# Patient Record
Sex: Female | Born: 1980 | Race: Black or African American | Hispanic: No | Marital: Single | Smoking: Current every day smoker
Health system: Southern US, Community
[De-identification: ages and names within clinical notes are randomized; demographics above are authoritative.]

## PROBLEM LIST (undated history)

## (undated) DIAGNOSIS — I10 Essential (primary) hypertension: Secondary | ICD-10-CM

---

## 2007-12-01 ENCOUNTER — Emergency Department (HOSPITAL_COMMUNITY): Admission: EM | Admit: 2007-12-01 | Discharge: 2007-12-01 | Payer: Self-pay | Admitting: Emergency Medicine

## 2008-03-06 ENCOUNTER — Emergency Department (HOSPITAL_COMMUNITY): Admission: EM | Admit: 2008-03-06 | Discharge: 2008-03-06 | Payer: Self-pay | Admitting: Emergency Medicine

## 2011-04-24 LAB — BASIC METABOLIC PANEL
CO2: 29
Chloride: 102
Creatinine, Ser: 0.72
GFR calc Af Amer: >60
Potassium: 3.7
Sodium: 137

## 2011-04-24 LAB — URINALYSIS, ROUTINE W REFLEX MICROSCOPIC
Bilirubin Urine: NEGATIVE
Glucose, UA: NEGATIVE
Ketones, ur: NEGATIVE
Protein, ur: NEGATIVE
pH: 6

## 2011-04-24 LAB — CBC
HCT: 37.7
Hemoglobin: 12.6
MCHC: 33.3
MCV: 84.2
RBC: 4.48
WBC: 5.8

## 2011-04-24 LAB — DIFFERENTIAL
Basophils Relative: 0
Eosinophils Absolute: 0.1
Eosinophils Relative: 2
Lymphs Abs: 1.7
Monocytes Absolute: 0.5
Monocytes Relative: 9
Neutrophils Relative %: 59

## 2011-04-24 LAB — URINE MICROSCOPIC-ADD ON

## 2012-01-25 ENCOUNTER — Encounter (HOSPITAL_COMMUNITY): Payer: Self-pay | Admitting: *Deleted

## 2012-01-25 ENCOUNTER — Emergency Department (HOSPITAL_COMMUNITY): Payer: Self-pay

## 2012-01-25 ENCOUNTER — Emergency Department (HOSPITAL_COMMUNITY)
Admission: EM | Admit: 2012-01-25 | Discharge: 2012-01-25 | Disposition: A | Discharge status: Home / Self Care | Payer: Self-pay | Attending: Emergency Medicine | Admitting: Emergency Medicine

## 2012-01-25 DIAGNOSIS — I10 Essential (primary) hypertension: Secondary | ICD-10-CM | POA: Insufficient documentation

## 2012-01-25 DIAGNOSIS — S63619A Unspecified sprain of unspecified finger, initial encounter: Secondary | ICD-10-CM

## 2012-01-25 DIAGNOSIS — S6390XA Sprain of unspecified part of unspecified wrist and hand, initial encounter: Secondary | ICD-10-CM | POA: Insufficient documentation

## 2012-01-25 DIAGNOSIS — X58XXXA Exposure to other specified factors, initial encounter: Secondary | ICD-10-CM | POA: Insufficient documentation

## 2012-01-25 HISTORY — DX: Essential (primary) hypertension: I10

## 2012-01-25 MED ORDER — MELOXICAM 7.5 MG PO TABS: 7.5000 mg | ORAL_TABLET | Freq: Every day | ORAL | Status: AC

## 2012-01-25 MED ORDER — KETOROLAC TROMETHAMINE 10 MG PO TABS
10.0000 mg | ORAL_TABLET | Freq: Once | ORAL | Status: AC
  Administered 2012-01-25: 10 mg via ORAL
  Filled 2012-01-25: qty 1

## 2012-01-25 NOTE — ED Notes (Signed)
Patient with no complaints at this time. Respirations even and unlabored. Skin warm/dry. Discharge instructions reviewed with patient at this time. Patient given opportunity to voice concerns/ask questions. Patient discharged at this time and left Emergency Department with steady gait.   

## 2012-01-25 NOTE — ED Notes (Signed)
Pt says she has not had a period in 3 years, she says she is "post menopausal".

## 2012-01-25 NOTE — ED Notes (Signed)
Pt. Relates that 3wks ago at work, R middle finger began to swell at knuckle joint.  Does not recall any injury that caused this and she has not hx of arthritis or any inflammatory illnesses.

## 2012-01-25 NOTE — ED Provider Notes (Signed)
History     CSN: [id number]  Arrival date & time 01/25/12  1059   First MD Initiated Contact with Patient 01/25/12 1209      Chief Complaint  Patient presents with  . Finger Injury    (Consider location/radiation/quality/duration/timing/severity/associated sxs/prior treatment) HPI Comments: Patient reports problems with pain in the left middle finger for approximately 3 weeks. She is unsure of the injury. She does not recall a bite of any kind. She's not had any fever. There's been no drainage from the middle finger. She presents at this time because she says she is concerned that it has been swollen and painful for so long. There's been no previous operations or procedures involving the left third finger.  The history is provided by the patient.    Past Medical History  Diagnosis Date  . Hypertension     History reviewed. No pertinent past surgical history.  History reviewed. No pertinent family history.  History  Substance Use Topics  . Smoking status: Never Smoker   . Smokeless tobacco: Not on file  . Alcohol Use: No    OB History    Grav Para Term Preterm Abortions TAB SAB Ect Mult Living                  Review of Systems  Constitutional: Negative for activity change.       All ROS Neg except as noted in HPI  HENT: Negative for nosebleeds and neck pain.   Eyes: Negative for photophobia and discharge.  Respiratory: Negative for cough, shortness of breath and wheezing.   Cardiovascular: Negative for chest pain and palpitations.  Gastrointestinal: Negative for abdominal pain and blood in stool.  Genitourinary: Negative for dysuria, frequency and hematuria.  Musculoskeletal: Negative for back pain and arthralgias.  Skin: Negative.   Neurological: Negative for dizziness, seizures and speech difficulty.  Psychiatric/Behavioral: Negative for hallucinations and confusion.    Allergies  Vicodin  Home Medications  No current outpatient prescriptions on  file.  BP 138/82  Pulse 78  Temp 97.9 F (36.6 C) (Oral)  Resp 20  Ht 5\' 6"  (1.676 m)  Wt 220 lb (99.791 kg)  BMI 35.51 kg/m2  SpO2 100%  Physical Exam  Nursing note and vitals reviewed. Constitutional: She is oriented to person, place, and time. She appears well-developed and well-nourished.  Non-toxic appearance.  HENT:  Head: Normocephalic.  Right Ear: Tympanic membrane and external ear normal.  Left Ear: Tympanic membrane and external ear normal.  Eyes: EOM and lids are normal. Pupils are equal, round, and reactive to light.  Neck: Normal range of motion. Neck supple. Carotid bruit is not present.  Cardiovascular: Normal rate, regular rhythm, normal heart sounds, intact distal pulses and normal pulses.   Pulmonary/Chest: Breath sounds normal. No respiratory distress.  Abdominal: Soft. Bowel sounds are normal. There is no tenderness. There is no guarding.  Musculoskeletal: Normal range of motion.       There is pain to palpation and flexion and extension of the PIP joint of the left third finger. There is mild swelling just above and just below the PIP joint. The area is not hot. There no bite marks appreciated. There's no deformity of the finger or the hand. There is good capillary refill and sensory.`  Lymphadenopathy:       Head (right side): No submandibular adenopathy present.       Head (left side): No submandibular adenopathy present.    She has no cervical adenopathy.  Neurological:  She is alert and oriented to person, place, and time. She has normal strength. No cranial nerve deficit or sensory deficit.  Skin: Skin is warm and dry.  Psychiatric: She has a normal mood and affect. Her speech is normal.    ED Course  Procedures (including critical care time)  Labs Reviewed - No data to display Dg Finger Middle Left  01/25/2012  *RADIOLOGY REPORT*  Clinical Data: Left middle finger pain and swelling at the PIP joint.  No known injury.  LEFT MIDDLE FINGER 2+V   Comparison: None.  Findings: Soft tissue swelling at the level of the third PIP joint. The underlying bones have normal appearances.  No soft tissue gas or radiopaque foreign body.  IMPRESSION: Soft tissue swelling at the level of the third PIP joint without underlying bony abnormality.  Original Report Authenticated By: Darrol Angel, M.D.     No diagnosis found.    MDM  I have reviewed nursing notes, vital signs, and all appropriate lab and imaging results for this patient.  Patient has a swollen left third finger. It is believed at this time that this is probably related to a sprain, As this has been going on for 3 weeks. The patient is fitted with a finger splint. She is treated with Mobic 7.5 mg twice a day. She will be allowed to use Tylenol in between the doses of Mobic if needed. She is given the name of the orthopedist if she should have any additional problems with this finger.     Kathie Dike, [place] 01/25/12 1353

## 2012-01-25 NOTE — Discharge Instructions (Signed)
Please use the finger splint for the next 7-10 days. Mobic 2 times daily with food. May use Tylenol Extra Strength in between the Mobic if needed for soreness. Please see Dr. Hilda Lias for evaluation if this is not improved in the next 3-4 days..Finger Sprain A finger sprain is a tear in one of the strong, fibrous tissues that connect the bones (ligaments) in your finger. The severity of the sprain depends on how much of the ligament is torn. The tear can be either partial or complete. CAUSES  Often, sprains are a result of a fall or accident. If you extend your hands to catch an object or to protect yourself, the force of the impact causes the fibers of your ligament to stretch too much. This excess tension causes the fibers of your ligament to tear. SYMPTOMS  You may have some loss of motion in your finger. Other symptoms include:  Bruising.   Tenderness.   Swelling.  DIAGNOSIS  In order to diagnose finger sprain, your caregiver will physically examine your finger or thumb to determine how torn the ligament is. Your caregiver may also suggest an X-ray exam of your finger to make sure no bones are broken. TREATMENT  If your ligament is only partially torn, treatment usually involves keeping the finger in a fixed position (immobilization) for a short period. To do this, your caregiver will apply a bandage, cast, or splint to keep your finger from moving until it heals. For a partially torn ligament, the healing process usually takes 2 to 3 weeks. If your ligament is completely torn, you may need surgery to reconnect the ligament to the bone. After surgery a cast or splint will be applied and will need to stay on your finger or thumb for 4 to 6 weeks while your ligament heals. HOME CARE INSTRUCTIONS  Keep your injured finger elevated, when possible, to decrease swelling.   To ease pain and swelling, apply ice to your joint twice a day, for 2 to 3 days:   Put ice in a plastic bag.   Place a  towel between your skin and the bag.   Leave the ice on for 15 minutes.   Only take over-the-counter or prescription medicine for pain as directed by your caregiver.   Do not wear rings on your injured finger.   Do not leave your finger unprotected until pain and stiffness go away (usually 3 to 4 weeks).   Do not allow your cast or splint to get wet. Cover your cast or splint with a plastic bag when you shower or bathe. Do not swim.   Your caregiver may suggest special exercises for you to do during your recovery to prevent or limit permanent stiffness.  SEEK IMMEDIATE MEDICAL CARE IF:  Your cast or splint becomes damaged.   Your pain becomes worse rather than better.  MAKE SURE YOU:  Understand these instructions.   Will watch your condition.   Will get help right away if you are not doing well or get worse.  Document Released: 08/20/2004 Document Revised: 07/02/2011 Document Reviewed: 03/16/2011 [facility] Patient Information 2012 [place], [place].

## 2012-01-25 NOTE — ED Notes (Signed)
Pain LMF x 3 weeks, painful , swollen , No known injury

## 2012-01-26 NOTE — ED Provider Notes (Signed)
Medical screening examination/treatment/procedure(s) were performed by non-physician practitioner and as supervising physician I was immediately available for consultation/collaboration.  Geoffery Lyons, MD 01/26/12 [phone]

## 2012-03-07 ENCOUNTER — Emergency Department (HOSPITAL_COMMUNITY): Payer: Self-pay

## 2012-03-07 ENCOUNTER — Emergency Department (HOSPITAL_COMMUNITY)
Admission: EM | Admit: 2012-03-07 | Discharge: 2012-03-07 | Disposition: A | Discharge status: Home / Self Care | Payer: Self-pay | Attending: Emergency Medicine | Admitting: Emergency Medicine

## 2012-03-07 ENCOUNTER — Encounter (HOSPITAL_COMMUNITY): Payer: Self-pay | Admitting: *Deleted

## 2012-03-07 DIAGNOSIS — S93505A Unspecified sprain of left lesser toe(s), initial encounter: Secondary | ICD-10-CM

## 2012-03-07 DIAGNOSIS — S93699A Other sprain of unspecified foot, initial encounter: Secondary | ICD-10-CM | POA: Insufficient documentation

## 2012-03-07 DIAGNOSIS — I10 Essential (primary) hypertension: Secondary | ICD-10-CM | POA: Insufficient documentation

## 2012-03-07 DIAGNOSIS — W2203XA Walked into furniture, initial encounter: Secondary | ICD-10-CM | POA: Insufficient documentation

## 2012-03-07 MED ORDER — IBUPROFEN 800 MG PO TABS
800.0000 mg | ORAL_TABLET | Freq: Once | ORAL | Status: AC
  Administered 2012-03-07: 800 mg via ORAL
  Filled 2012-03-07: qty 1

## 2012-03-07 MED ORDER — IBUPROFEN 800 MG PO TABS: 800.0000 mg | ORAL_TABLET | Freq: Three times a day (TID) | ORAL | Status: AC

## 2012-03-07 MED ORDER — IBUPROFEN 800 MG PO TABS: 800.0000 mg | ORAL_TABLET | Freq: Once | ORAL | Status: AC

## 2012-03-07 NOTE — ED Notes (Signed)
Lt 4th toe injury this am

## 2012-03-07 NOTE — ED Notes (Signed)
Patient with no complaints at this time. Respirations even and unlabored. Skin warm/dry. Discharge instructions reviewed with patient at this time. Patient given opportunity to voice concerns/ask questions. Patient discharged at this time and left Emergency Department with steady gait.   

## 2012-03-09 NOTE — ED Provider Notes (Signed)
History     CSN: [id number]  Arrival date & time 03/07/12  1900   First MD Initiated Contact with Patient 03/07/12 1944      Chief Complaint  Patient presents with  . Toe Injury    (Consider location/radiation/quality/duration/timing/severity/associated sxs/prior treatment) HPI Comments: Sandra Acevedo presents for evaluation of pain of her left fourth toe.  She stubbed her toe against a piece of furniture in her home this morning causing pain and swelling since that time.  Pain is throbbing, constant without radiation and worse with attempts at walking and with palpation.  She denies any numbness in the  toe.  She has used ice and elevation with minimal relief of symptoms.    The history is provided by the patient.    Past Medical History  Diagnosis Date  . Hypertension     History reviewed. No pertinent past surgical history.  History reviewed. No pertinent family history.  History  Substance Use Topics  . Smoking status: Never Smoker   . Smokeless tobacco: Not on file  . Alcohol Use: No    OB History    Grav Para Term Preterm Abortions TAB SAB Ect Mult Living                  Review of Systems  Musculoskeletal: Positive for joint swelling and arthralgias.  Skin: Negative for wound.  Neurological: Negative for weakness and numbness.    Allergies  Vicodin  Home Medications   Current Outpatient Rx  Name Route Sig Dispense Refill  . IBUPROFEN 800 MG PO TABS Oral Take 1 tablet (800 mg total) by mouth 3 (three) times daily. 21 tablet 0  . IBUPROFEN 800 MG PO TABS Oral Take 1 tablet (800 mg total) by mouth once. 21 tablet 0  . MELOXICAM 7.5 MG PO TABS Oral Take 1 tablet (7.5 mg total) by mouth daily. 12 tablet 0    BP 129/84  Pulse 72  Temp 98 F (36.7 C) (Oral)  Resp 20  Ht 5\' 7"  (1.702 m)  Wt 210 lb (95.255 kg)  BMI 32.89 kg/m2  SpO2 100%  LMP 03/07/2009  Physical Exam  Constitutional: She appears well-developed and well-nourished.  HENT:    Head: Atraumatic.  Neck: Normal range of motion.  Cardiovascular:       Pulses equal bilaterally  Musculoskeletal:       Left foot: She exhibits bony tenderness and swelling. She exhibits normal capillary refill and no deformity.       Feet:  Neurological: She is alert. She has normal strength. She displays normal reflexes. No sensory deficit.       Equal strength  Skin: Skin is warm and dry.  Psychiatric: She has a normal mood and affect.    ED Course  Procedures (including critical care time)  Labs Reviewed - No data to display Dg Toe 4th Left  03/07/2012  *RADIOLOGY REPORT*  Clinical Data: Blow to the fourth toe.  Pain.  LEFT FOURTH TOE  Comparison: None.  Findings: Imaged bones, joints and soft tissues appear normal.  IMPRESSION: Negative exam.  Original Report Authenticated By: Bernadene Bell. D'ALESSIO, M.D.     1. Sprain of fourth toe of left foot       MDM  X-rays reviewed and discussed with patient.  No fracture.  She was placed in a postop shoe for comfort.  She was encouraged to continue ice and elevation as much as possible for the next several days.  Ibuprofen 800  mg 3 times a day.  Followup with PCP if symptoms are not improving over the next week.        Burgess Amor, [place] 03/09/12 1538

## 2012-03-13 NOTE — ED Provider Notes (Signed)
Medical screening examination/treatment/procedure(s) were performed by non-physician practitioner and as supervising physician I was immediately available for consultation/collaboration.   Shelda Jakes, MD 03/13/12 [phone]

## 2013-02-01 DIAGNOSIS — R079 Chest pain, unspecified: Secondary | ICD-10-CM

## 2014-01-12 IMAGING — CR DG TOE 4TH 2+V*L*
2 series · 2 of 2 positions shown · non-contrast
Comparison: None.

CLINICAL DATA: Blow to the fourth toe.  Pain.

LEFT FOURTH TOE

[view not recorded (1 of 2)]
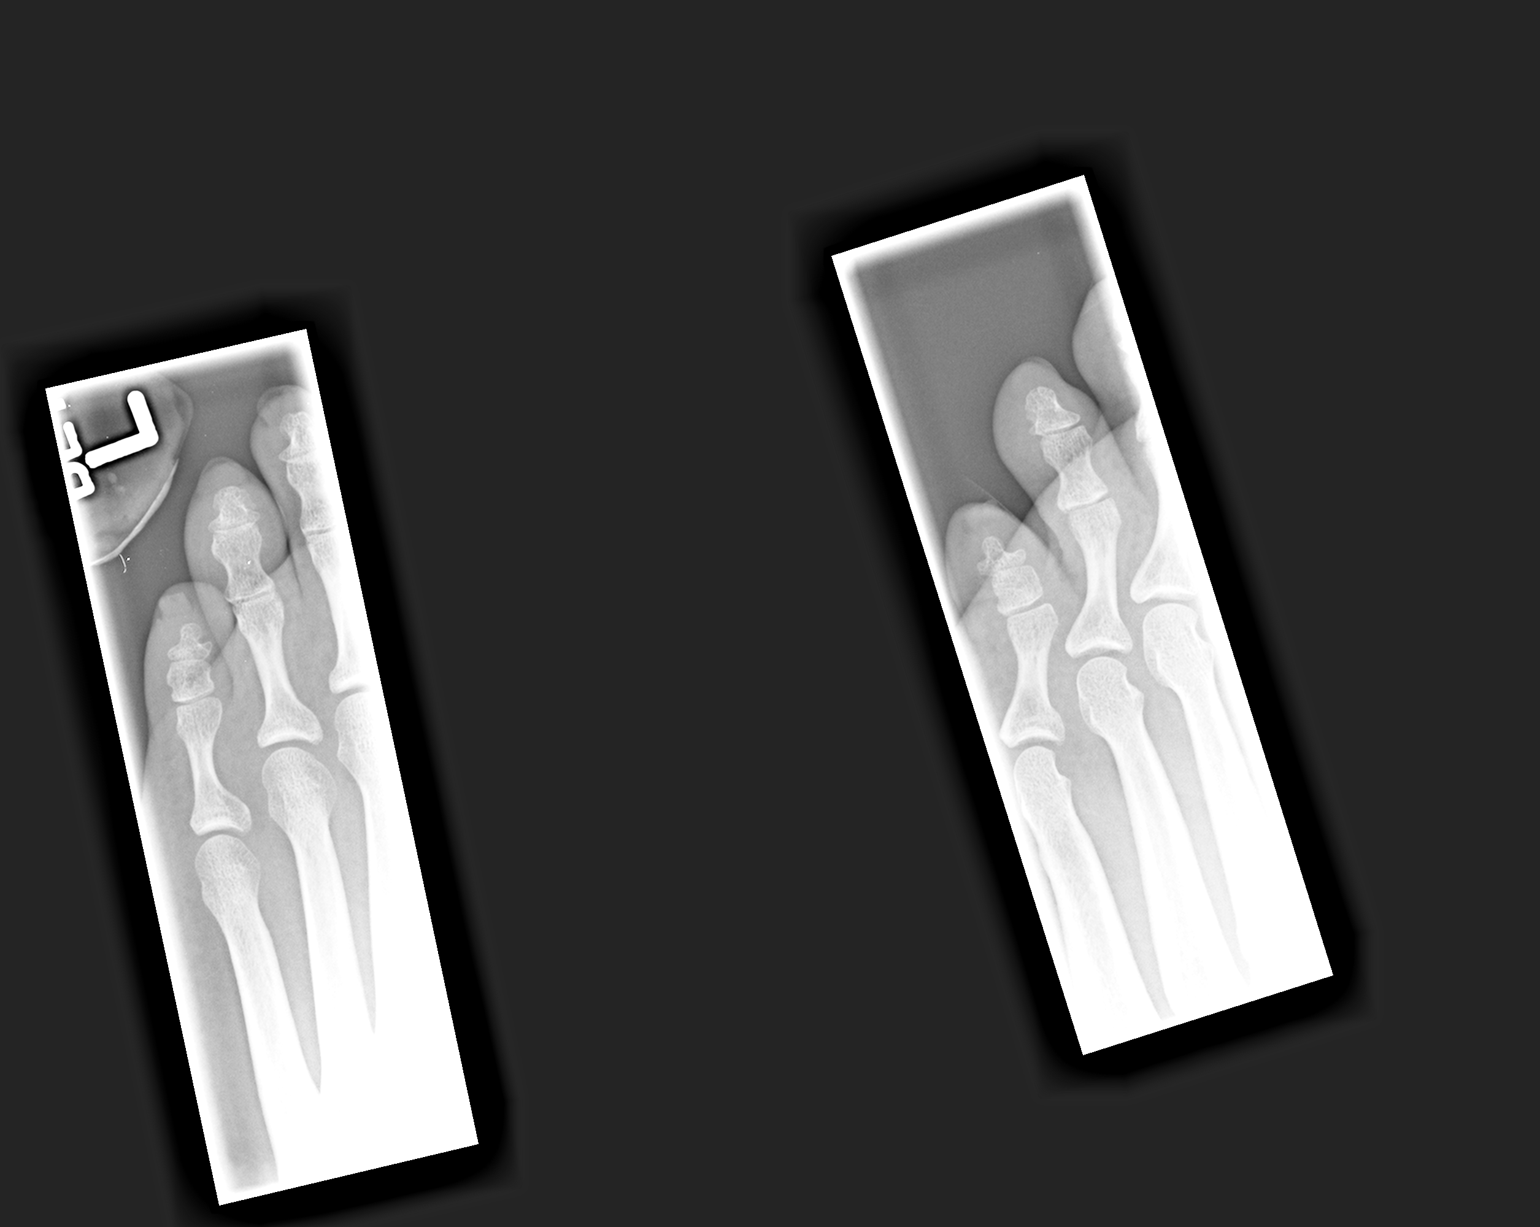

[view not recorded (2 of 2)]
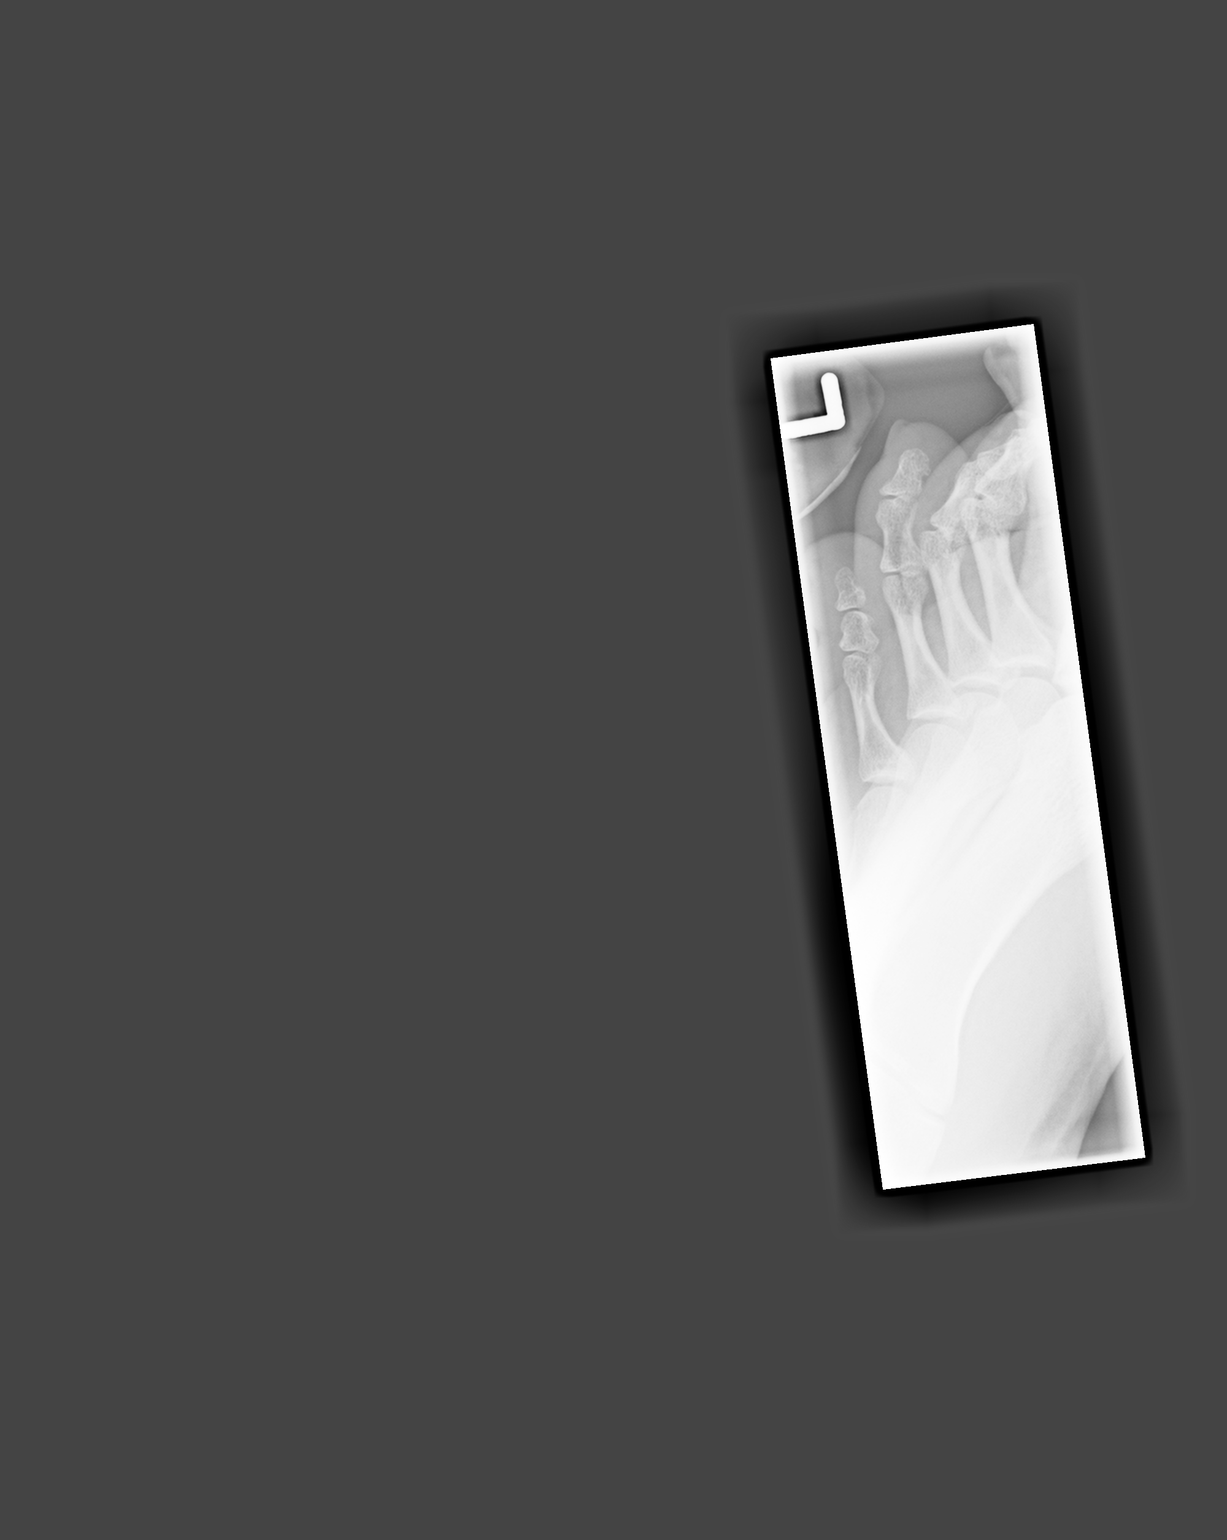

[2 of 2 positions shown; findings below may reference images not displayed]

FINDINGS: Imaged bones, joints and soft tissues appear normal.
IMPRESSION: Negative exam.

## 2017-05-15 ENCOUNTER — Encounter (HOSPITAL_COMMUNITY): Payer: Self-pay | Admitting: Emergency Medicine

## 2017-05-15 ENCOUNTER — Emergency Department (HOSPITAL_COMMUNITY)
Admission: EM | Admit: 2017-05-15 | Discharge: 2017-05-15 | Disposition: A | Discharge status: Home / Self Care | Payer: Self-pay | Attending: Emergency Medicine | Admitting: Emergency Medicine

## 2017-05-15 DIAGNOSIS — M7711 Lateral epicondylitis, right elbow: Secondary | ICD-10-CM | POA: Insufficient documentation

## 2017-05-15 DIAGNOSIS — F17210 Nicotine dependence, cigarettes, uncomplicated: Secondary | ICD-10-CM | POA: Insufficient documentation

## 2017-05-15 DIAGNOSIS — I10 Essential (primary) hypertension: Secondary | ICD-10-CM | POA: Insufficient documentation

## 2017-05-15 MED ORDER — IBUPROFEN 800 MG PO TABS
800.0000 mg | ORAL_TABLET | Freq: Once | ORAL | Status: AC
  Administered 2017-05-15: 800 mg via ORAL
  Filled 2017-05-15: qty 1

## 2017-05-15 MED ORDER — IBUPROFEN 800 MG PO TABS: 800.0000 mg | ORAL_TABLET | Freq: Three times a day (TID) | ORAL | 0 refills | Status: AC

## 2017-05-15 NOTE — ED Triage Notes (Signed)
Patient has c/o of right elbow pain approx 3 weeks ago, radiating from elbow to fingers.

## 2017-05-15 NOTE — Discharge Instructions (Signed)
Apply ice packs on/off to your elbow.  You can buy a tennis elbow strap from [facility] to wear at work.  Call Dr. Mort SawyersHarrison's office to arrange a follow-up appt. In one week if not improving

## 2017-05-15 NOTE — ED Provider Notes (Signed)
[facility] EMERGENCY DEPARTMENT Provider Note   CSN: [id number] Arrival date & time: 05/15/17  1952     History   Chief Complaint Chief Complaint  Patient presents with  . Elbow Pain    HPI Sandra Acevedo is a 36 y.o. female.  HPI   Sandra Acevedo is a 36 y.o. female who presents to the Emergency Department complaining of right elbow pain for 3 weeks.  Describes a throbbing pain to the lateral right elbow that is intermittently radiating into fingers of the right hand.  Pain worse with rotation of the elbow and full extension of the right arm.  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  She has not tried any OTC pain relievers.  She denies redness, swelling, neck or shoulder pain or numbness.    Past Medical History:  Diagnosis Date  . Hypertension     There are no active problems to display for this patient.   History reviewed. No pertinent surgical history.  OB History    No data available       Home Medications    Prior to Admission medications   Medication Sig Start Date End Date Taking? Authorizing Provider  ibuprofen (ADVIL,MOTRIN) 800 MG tablet Take 1 tablet (800 mg total) by mouth 3 (three) times daily. Take with food 05/15/17   Pauline Ausriplett, Aleece Loyd, PA-C    Family History History reviewed. No pertinent family history.  Social History Social History  Substance Use Topics  . Smoking status: Current Every Day Smoker    Packs/day: 0.50  . Smokeless tobacco: Never Used  . Alcohol use No     Allergies   Vicodin [hydrocodone-acetaminophen]   Review of Systems Review of Systems  Constitutional: Negative for chills and fever.  Cardiovascular: Negative for chest pain.  Gastrointestinal: Negative for abdominal pain, nausea and vomiting.  Genitourinary: Negative for difficulty urinating and dysuria.  Musculoskeletal: Positive for arthralgias (right elbow pain). Negative for joint swelling, neck pain and neck  stiffness.  Skin: Negative for color change and wound.  Neurological: Negative for dizziness, weakness and numbness.  All other systems reviewed and are negative.    Physical Exam Updated Vital Signs BP (!) 137/92 (BP Location: Left Arm)   Pulse 79   Temp 98.4 F (36.9 C) (Oral)   Resp 18   Ht 5\' 6"  (1.676 m)   Wt 108.9 kg (240 lb)   LMP 03/07/2009   SpO2 100%   BMI 38.74 kg/m   Physical Exam  Constitutional: She is oriented to person, place, and time. She appears well-developed and well-nourished. No distress.  HENT:  Head: Normocephalic and atraumatic.  Cardiovascular: Normal rate, regular rhythm and intact distal pulses.   Pulmonary/Chest: Effort normal and breath sounds normal.  Musculoskeletal: She exhibits tenderness. She exhibits no edema.       Right hand: She exhibits normal range of motion, normal two-point discrimination and no swelling. Normal sensation noted. Normal strength noted. She exhibits no finger abduction and no wrist extension trouble.  Focal ttp and ROM of the lateral right elbow. No edema, erythema or excessive warmth.    Neurological: She is alert and oriented to person, place, and time. No sensory deficit. She exhibits normal muscle tone. Coordination normal.  Skin: Skin is warm and dry. Capillary refill takes less than 2 seconds.  Psychiatric: She has a normal mood and affect.  Nursing note and vitals reviewed.     ED Treatments / Results  Labs (all labs ordered are listed, but only abnormal results are displayed) Labs Reviewed - No data to display  EKG  EKG Interpretation None       Radiology No results found.  Procedures Procedures (including critical care time)  Medications Ordered in ED Medications  ibuprofen (ADVIL,MOTRIN) tablet 800 mg (800 mg Oral Given 05/15/17 2021)     Initial Impression / Assessment and Plan / ED Course  I have reviewed the triage vital signs and the nursing notes.  Pertinent labs & imaging results  that were available during my care of the patient were reviewed by me and considered in my medical decision making (see chart for details).     Pt well appearing, NV intact.  Focal tenderness over the lateral epicondyle.  No motor deficits.  No concerning sx's for septic joint.  Likely inflammatory.  Pt agrees to supportive brace and NSAID.  Appears stable for d/c  Final Clinical Impressions(s) / ED Diagnoses   Final diagnoses:  Lateral epicondylitis of right elbow    New Prescriptions Discharge Medication List as of 05/15/2017  8:17 PM    START taking these medications   Details  ibuprofen (ADVIL,MOTRIN) 800 MG tablet Take 1 tablet (800 mg total) by mouth 3 (three) times daily. Take with food, Starting Sat 05/15/2017, Print         Pauline Aus, [place] 05/15/17 2300    Mesner, Barbara Cower, MD 05/17/17 1735

## 2017-08-17 ENCOUNTER — Encounter (HOSPITAL_COMMUNITY): Payer: Self-pay | Admitting: Emergency Medicine

## 2017-08-17 ENCOUNTER — Emergency Department (HOSPITAL_COMMUNITY): Payer: Self-pay

## 2017-08-17 ENCOUNTER — Emergency Department (HOSPITAL_COMMUNITY)
Admission: EM | Admit: 2017-08-17 | Discharge: 2017-08-17 | Disposition: A | Discharge status: Home / Self Care | Payer: Self-pay | Attending: Emergency Medicine | Admitting: Emergency Medicine

## 2017-08-17 ENCOUNTER — Other Ambulatory Visit: Payer: Self-pay

## 2017-08-17 DIAGNOSIS — Y999 Unspecified external cause status: Secondary | ICD-10-CM | POA: Insufficient documentation

## 2017-08-17 DIAGNOSIS — M19071 Primary osteoarthritis, right ankle and foot: Secondary | ICD-10-CM | POA: Insufficient documentation

## 2017-08-17 DIAGNOSIS — X58XXXA Exposure to other specified factors, initial encounter: Secondary | ICD-10-CM | POA: Insufficient documentation

## 2017-08-17 DIAGNOSIS — S93401A Sprain of unspecified ligament of right ankle, initial encounter: Secondary | ICD-10-CM | POA: Insufficient documentation

## 2017-08-17 DIAGNOSIS — Y929 Unspecified place or not applicable: Secondary | ICD-10-CM | POA: Insufficient documentation

## 2017-08-17 DIAGNOSIS — Y939 Activity, unspecified: Secondary | ICD-10-CM | POA: Insufficient documentation

## 2017-08-17 DIAGNOSIS — I10 Essential (primary) hypertension: Secondary | ICD-10-CM | POA: Insufficient documentation

## 2017-08-17 MED ORDER — IBUPROFEN 800 MG PO TABS
800.0000 mg | ORAL_TABLET | Freq: Once | ORAL | Status: AC
  Administered 2017-08-17: 800 mg via ORAL
  Filled 2017-08-17: qty 1

## 2017-08-17 NOTE — ED Provider Notes (Signed)
[facility] Provider Note   CSN: [id number] Arrival date & time: 08/17/17  1053     History   Chief Complaint Chief Complaint  Patient presents with  . Foot Pain    HPI Jeri CosRolonda L Shrider is a 37 y.o. female.  Patient is a 37 year old female who presents to the emergency department with a complaint of right foot pain.  The patient states this problem started approximately 2 weeks ago.  She noticed it while walking.  She states she is not had any known injury to the area.  The problem got progressively worse.  She began to walk more on the big toe portion of her foot because she was having pain on the little toe area.  Now she has even more pain, particularly with standing or walking.  She states that it is interfering with her activities of daily living and her work.  She request evaluation of this issue.  No previous operations or procedures involving the right lower extremity.      Past Medical History:  Diagnosis Date  . Hypertension     There are no active problems to display for this patient.   History reviewed. No pertinent surgical history.  OB History    Gravida Para Term Preterm AB Living   2 2 2          SAB TAB Ectopic Multiple Live Births                   Home Medications    Prior to Admission medications   Medication Sig Start Date End Date Taking? Authorizing Provider  ibuprofen (ADVIL,MOTRIN) 800 MG tablet Take 1 tablet (800 mg total) by mouth 3 (three) times daily. Take with food 05/15/17   Pauline Ausriplett, Tammy, PA-C    Family History History reviewed. No pertinent family history.  Social History Social History   Tobacco Use  . Smoking status: Current Every Day Smoker    Packs/day: 0.50  . Smokeless tobacco: Never Used  Substance Use Topics  . Alcohol use: No  . Drug use: No     Allergies   Vicodin [hydrocodone-acetaminophen]   Review of Systems Review of Systems  Constitutional: Negative for activity change.         All ROS Neg except as noted in HPI  HENT: Negative for nosebleeds.   Eyes: Negative for photophobia and discharge.  Respiratory: Negative for cough, shortness of breath and wheezing.   Cardiovascular: Negative for chest pain and palpitations.  Gastrointestinal: Negative for abdominal pain and blood in stool.  Genitourinary: Negative for dysuria, frequency and hematuria.  Musculoskeletal: Positive for arthralgias. Negative for back pain and neck pain.       Foot pain  Skin: Negative.   Neurological: Negative for dizziness, seizures and speech difficulty.  Psychiatric/Behavioral: Negative for confusion and hallucinations.     Physical Exam Updated Vital Signs BP (!) 139/95 (BP Location: Right Arm)   Pulse 67   Temp 97.9 F (36.6 C) (Oral)   Resp 20   Ht 5\' 6"  (1.676 m)   Wt 113.4 kg (250 lb)   LMP 03/07/2009 Comment: Patient states she hasn't had a period in years  SpO2 100%   BMI 40.35 kg/m   Physical Exam  Constitutional: She is oriented to person, place, and time. She appears well-developed and well-nourished.  Non-toxic appearance.  HENT:  Head: Normocephalic.  Right Ear: Tympanic membrane and external ear normal.  Left Ear: Tympanic membrane and external ear normal.  Eyes: EOM and lids are normal. Pupils are equal, round, and reactive to light.  Neck: Normal range of motion. Neck supple. Carotid bruit is not present.  Cardiovascular: Normal rate, regular rhythm, normal heart sounds, intact distal pulses and normal pulses.  Pulmonary/Chest: Breath sounds normal. No respiratory distress.  Abdominal: Soft. Bowel sounds are normal. There is no tenderness. There is no guarding.  Musculoskeletal: Normal range of motion.       Right foot: There is tenderness. There is normal capillary refill, no deformity and no laceration.       Feet:  Lymphadenopathy:       Head (right side): No submandibular adenopathy present.       Head (left side): No submandibular adenopathy  present.    She has no cervical adenopathy.  Neurological: She is alert and oriented to person, place, and time. She has normal strength. No cranial nerve deficit or sensory deficit.  Skin: Skin is warm and dry.  Psychiatric: She has a normal mood and affect. Her speech is normal.  Nursing note and vitals reviewed.    ED Treatments / Results  Labs (all labs ordered are listed, but only abnormal results are displayed) Labs Reviewed - No data to display  EKG  EKG Interpretation None       Radiology Dg Foot Complete Right  Result Date: 08/17/2017 CLINICAL DATA:  Right foot pain.  No known injury. EXAM: RIGHT FOOT COMPLETE - 3+ VIEW COMPARISON:  No recent prior. FINDINGS: Diffuse degenerative change. Diffuse degenerative change. Small fracture chip is noted along the distal aspect of the proximal phalanx of the right fifth digit. This fracture chip as corticated margins is most likely old. No evidence of acute fracture. IMPRESSION: No acute abnormality identified. Diffuse mild degenerative change. Tiny corticated bony density noted along the distal aspect of the proximal phalanx of the right fifth digit. This is most likely an old fracture chip. Electronically Signed   By: Maisie Fus  Register   On: 08/17/2017 12:04    Procedures Procedures (including critical care time)  Medications Ordered in ED Medications  ibuprofen (ADVIL,MOTRIN) tablet 800 mg (800 mg Oral Given 08/17/17 1307)     Initial Impression / Assessment and Plan / ED Course  I have reviewed the triage vital signs and the nursing notes.  Pertinent labs & imaging results that were available during my care of the patient were reviewed by me and considered in my medical decision making (see chart for details).       Final Clinical Impressions(s) / ED Diagnoses MDM  X-ray is negative for acute fracture.  There are arthritis changes throughout the foot and ankle area.  The examination favors ankle sprain.  There are no  previous history of operations or procedures involving the foot.  No previous injury or trauma reported.  No hot joints appreciated.  No changes in vital signs.  I suspect that the patient has a ankle sprain as well as arthritis changes present. Patient fitted with an ASO splint and postop shoe.  Patient will use Tylenol every 4 hours or Profen every 6 hours for discomfort.  Patient will follow up with orthopedics if not improving.  Patient is in agreement with this plan.   Final diagnoses:  Sprain of right ankle, unspecified ligament, initial encounter  Primary osteoarthritis of right foot    ED Discharge Orders    None       Ivery Quale, PA-C 08/17/17 1428    Loren Racer, MD 08/18/17 [phone]

## 2017-08-17 NOTE — Discharge Instructions (Signed)
Your x-ray is negative for fracture or dislocation.  Your x-ray does show evidence of arthritis at multiple sites in your foot and ankle area.  Please use your ankle stirrup splint when up and about.  You do not need to sleep in this device.  Please use the device for the next 5-7 days.  Please see Dr. Romeo AppleHarrison for additional evaluation if not improving.  Use Tylenol every 4 hours or ibuprofen every 6 hours for soreness.  Please return to the emergency department if any emergent changes, problems, or concerns.

## 2017-08-17 NOTE — ED Triage Notes (Signed)
Patient reports R foot pain that started 2 weeks ago. No known injury.

## 2019-06-24 IMAGING — DX DG FOOT COMPLETE 3+V*R*
3 series · 3 of 3 positions shown · non-contrast
Comparison: No recent prior.

CLINICAL DATA: Right foot pain.  No known injury.

EXAM:
RIGHT FOOT COMPLETE - 3+ VIEW

[foot ap]
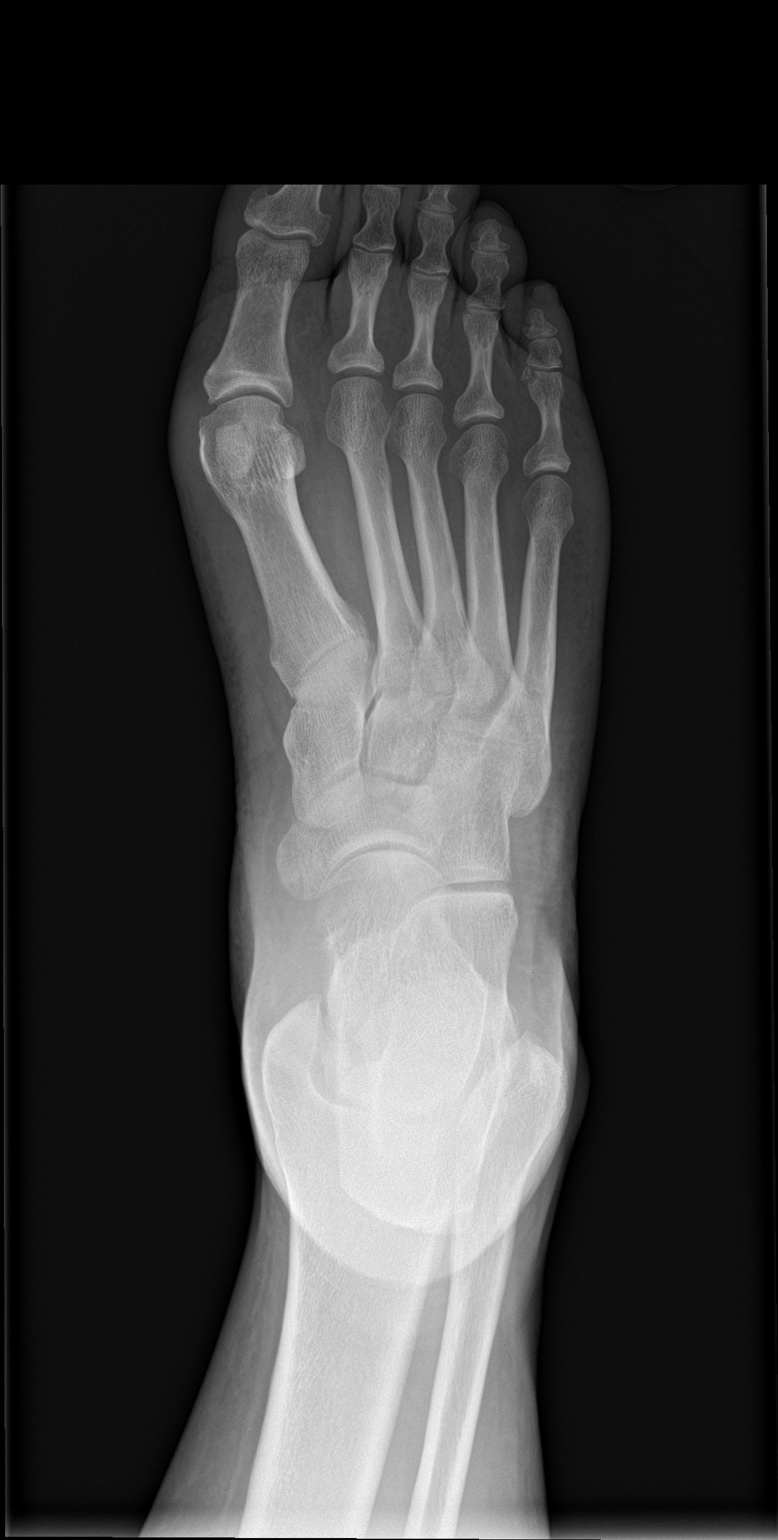

[foot obl]
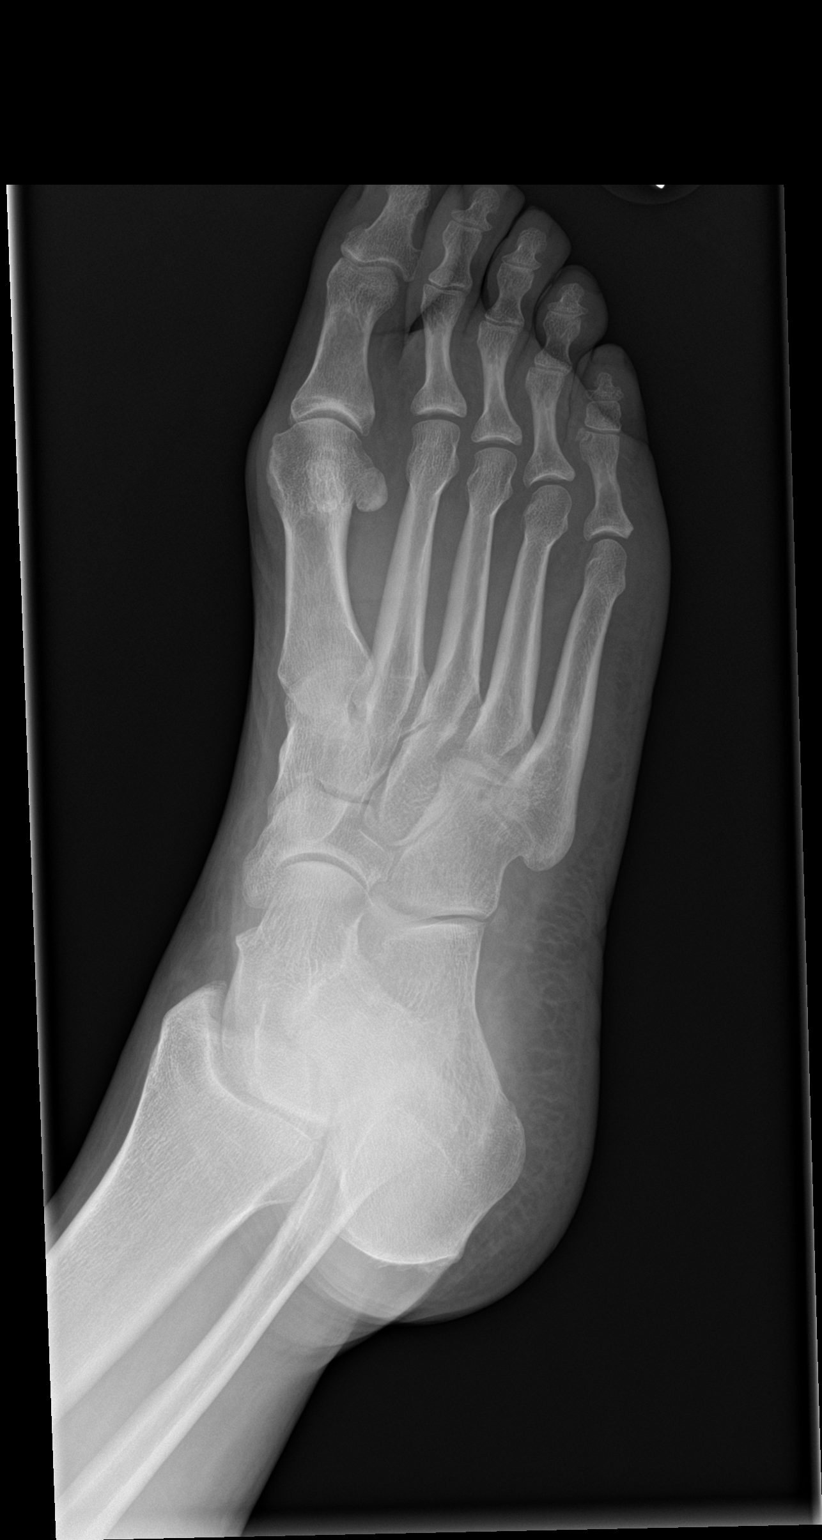

[foot lat]
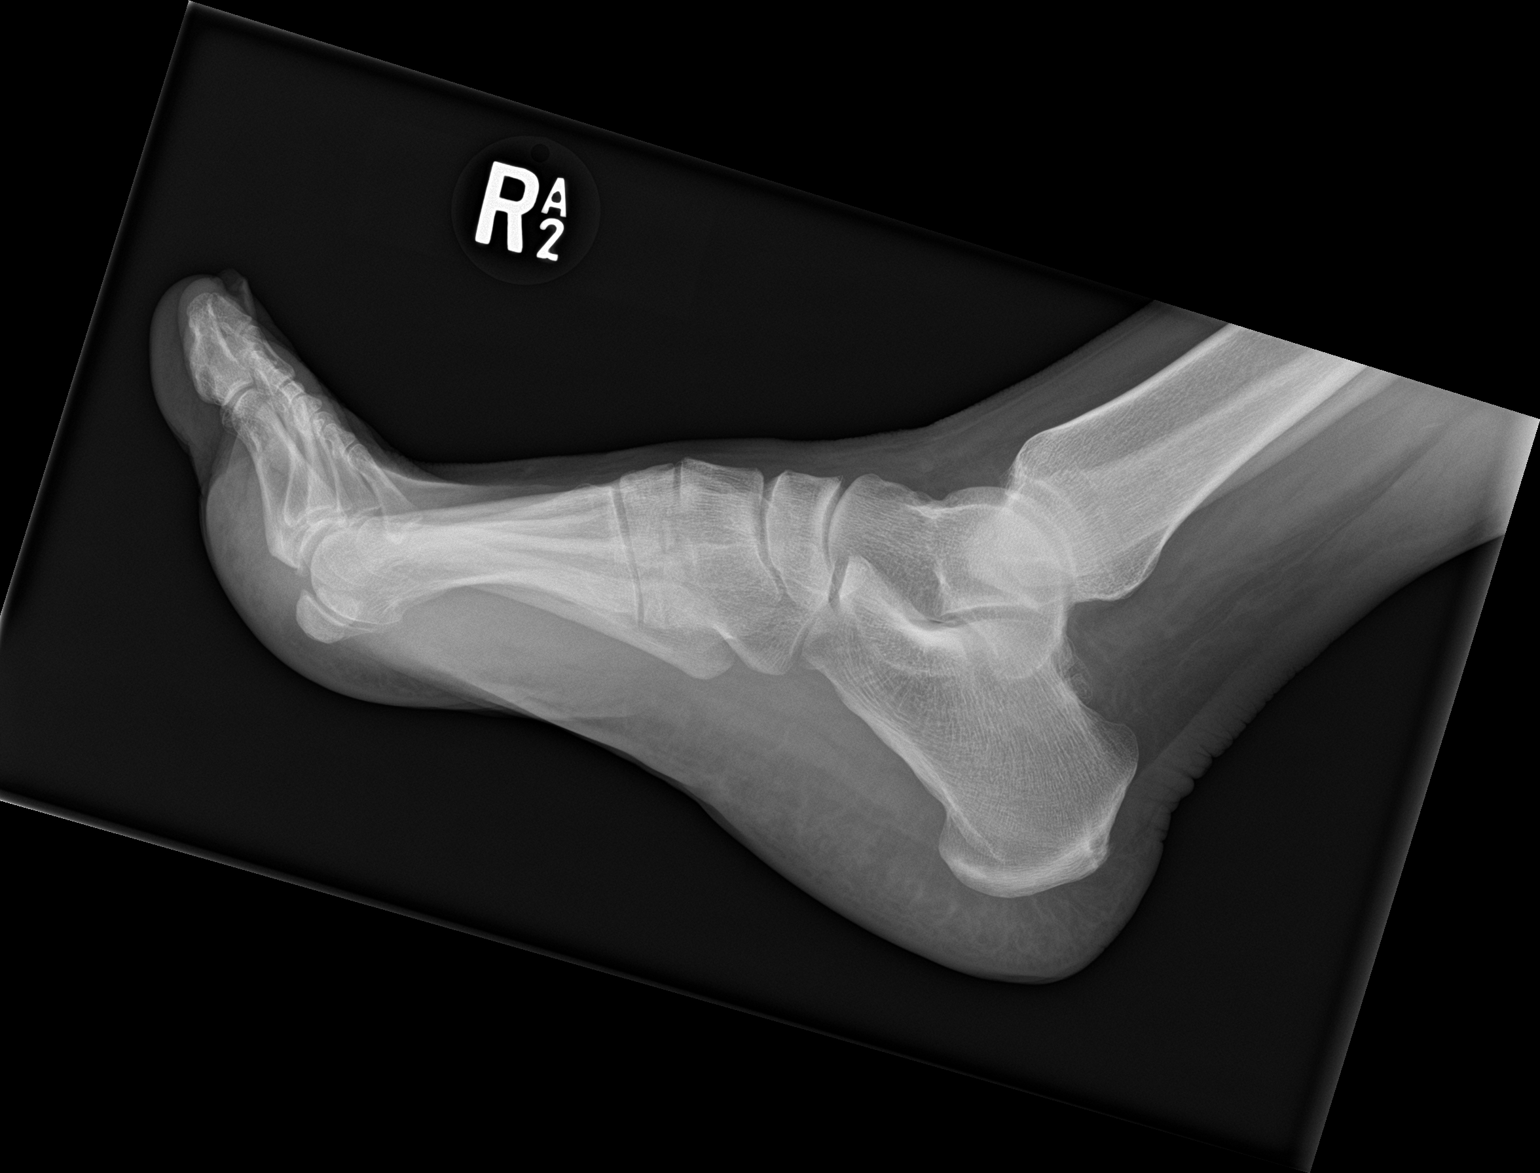

[3 of 3 positions shown; findings below may reference images not displayed]

FINDINGS: Diffuse degenerative change. Diffuse degenerative change. Small
fracture chip is noted along the distal aspect of the proximal
phalanx of the right fifth digit. This fracture chip as corticated
margins is most likely old. No evidence of acute fracture.
IMPRESSION: No acute abnormality identified. Diffuse mild degenerative change.
Tiny corticated bony density noted along the distal aspect of the
proximal phalanx of the right fifth digit. This is most likely an
old fracture chip.

## 2019-10-26 ENCOUNTER — Ambulatory Visit: Payer: Self-pay

## 2019-11-02 ENCOUNTER — Ambulatory Visit: Payer: Self-pay | Attending: Internal Medicine

## 2019-11-02 DIAGNOSIS — Z23 Encounter for immunization: Secondary | ICD-10-CM

## 2019-11-02 NOTE — Progress Notes (Signed)
   Covid-19 Vaccination Clinic  Name:  Sandra Acevedo    MRN: [id number] DOB: [birth year]  11/02/2019  [name] was observed post Covid-19 immunization for 15 minutes without incident. She was provided with Vaccine Information Sheet and instruction to access the V-Safe system.   [name] was instructed to call 911 with any severe reactions post vaccine: Marland Kitchen Difficulty breathing  . Swelling of face and throat  . A fast heartbeat  . A bad rash all over body  . Dizziness and weakness   Immunizations Administered    Name Date Dose VIS Date Route   Moderna COVID-19 Vaccine 11/02/2019 11:35 AM 0.5 mL 06/27/2019 Intramuscular   Manufacturer: Moderna   Lot: [id number]   NDC: 33825-053-97

## 2019-11-30 ENCOUNTER — Ambulatory Visit: Payer: Self-pay | Attending: Internal Medicine

## 2019-11-30 DIAGNOSIS — Z23 Encounter for immunization: Secondary | ICD-10-CM

## 2019-11-30 NOTE — Progress Notes (Signed)
   Covid-19 Vaccination Clinic  Name:  Sandra Acevedo    MRN: [id number] DOB: 11/22/1980  11/30/2019  Sandra Acevedo was observed post Covid-19 immunization for 15 minutes without incident. She was provided with Vaccine Information Sheet and instruction to access the V-Safe system.   Sandra Acevedo was instructed to call 911 with any severe reactions post vaccine: Marland Kitchen Difficulty breathing  . Swelling of face and throat  . A fast heartbeat  . A bad rash all over body  . Dizziness and weakness   Immunizations Administered    Name Date Dose VIS Date Route   Moderna COVID-19 Vaccine 11/30/2019 10:50 AM 0.5 mL 06/2019 Intramuscular   Manufacturer: Moderna   Lot: [id number]   NDC: 37943-276-14

## 2021-02-10 ENCOUNTER — Emergency Department (HOSPITAL_COMMUNITY)
Admission: EM | Admit: 2021-02-10 | Discharge: 2021-02-10 | Discharge status: Against Medical Advice | Payer: BC Managed Care – PPO | Attending: Emergency Medicine | Admitting: Emergency Medicine

## 2021-02-10 ENCOUNTER — Other Ambulatory Visit: Payer: Self-pay

## 2021-02-10 ENCOUNTER — Encounter (HOSPITAL_COMMUNITY): Payer: Self-pay | Admitting: *Deleted

## 2021-02-10 DIAGNOSIS — M545 Low back pain, unspecified: Secondary | ICD-10-CM | POA: Insufficient documentation

## 2021-02-10 DIAGNOSIS — Z5321 Procedure and treatment not carried out due to patient leaving prior to being seen by health care provider: Secondary | ICD-10-CM | POA: Diagnosis not present

## 2021-02-10 NOTE — ED Notes (Signed)
Pt was upset with the time that it was taking to get pain relief. Zero noted orders from the MD. Offered to help pt from wheelchair to recliner in room. Pt declined. Pt was walking around room. Pt chose to leave AMA. Pt then ambulated self very quickly from room to lobby without any difficulty.

## 2021-02-10 NOTE — ED Provider Notes (Signed)
Pt eloped prior to being seen by me. I did not establish care with her.   Sandra Acevedo 02/10/21 2110    Mancel Bale, MD 02/11/21 0003

## 2021-02-10 NOTE — ED Triage Notes (Signed)
Pt c/o lower back pain to left side; pt having hard time getting from sitting to standing; pt denies any obvious injury
